# Patient Record
Sex: Male | Born: 1995 | Race: Black or African American | Hispanic: No | Marital: Single | Smoking: Never smoker
Health system: Southern US, Community
[De-identification: ages and names within clinical notes are randomized; demographics above are authoritative.]

---

## 2018-12-23 ENCOUNTER — Encounter (HOSPITAL_COMMUNITY): Payer: Self-pay | Admitting: Emergency Medicine

## 2018-12-23 ENCOUNTER — Emergency Department (HOSPITAL_COMMUNITY)
Admission: EM | Admit: 2018-12-23 | Discharge: 2018-12-23 | Disposition: A | Discharge status: Home / Self Care | Payer: 59 | Attending: Emergency Medicine | Admitting: Emergency Medicine

## 2018-12-23 ENCOUNTER — Other Ambulatory Visit: Payer: Self-pay

## 2018-12-23 DIAGNOSIS — R110 Nausea: Secondary | ICD-10-CM | POA: Diagnosis present

## 2018-12-23 DIAGNOSIS — R1110 Vomiting, unspecified: Secondary | ICD-10-CM | POA: Diagnosis not present

## 2018-12-23 NOTE — ED Provider Notes (Signed)
[facility] EMERGENCY DEPARTMENT Provider Note   CSN: [id number] Arrival date & time: 12/23/18  1612    History   Chief Complaint Chief Complaint  Patient presents with  . Nausea    HPI Luis Webb is a 23 y.o. male.     The history is provided by the patient. No language interpreter was used.  Emesis  Severity:  Moderate Duration:  3 days Timing:  Constant Progression:  Resolved Relieved by:  Nothing Worsened by:  Nothing Ineffective treatments:  None tried Associated symptoms: no abdominal pain   Risk factors: no alcohol use   Pt reports he became sick on Tuesday.  Pt had vomiting.  Vomiting has resolved but he has to have note to return to work  Pt feels like he can return tomorrow   History reviewed. No pertinent past medical history.  There are no active problems to display for this patient.   History reviewed. No pertinent surgical history.      Home Medications    Prior to Admission medications   Not on File    Family History History reviewed. No pertinent family history.  Social History Social History   Tobacco Use  . Smoking status: Never Smoker  Substance Use Topics  . Alcohol use: Never    Frequency: Never  . Drug use: Never     Allergies   Patient has no known allergies.   Review of Systems Review of Systems  Gastrointestinal: Positive for vomiting. Negative for abdominal pain.  All other systems reviewed and are negative.    Physical Exam Updated Vital Signs BP 140/81 (BP Location: Right Arm)   Pulse 74   Temp 97.9 F (36.6 C) (Oral)   Resp 18   Ht 5\' 10"  (1.778 m)   Wt 79.4 kg   SpO2 100%   BMI 25.11 kg/m   Physical Exam Vitals signs and nursing note reviewed.  Constitutional:      Appearance: He is well-developed.  HENT:     Head: Normocephalic and atraumatic.     Right Ear: Tympanic membrane normal.     Left Ear: Tympanic membrane normal.     Nose: Nose normal.     Mouth/Throat:     Mouth: Mucous  membranes are moist.  Eyes:     Conjunctiva/sclera: Conjunctivae normal.  Neck:     Musculoskeletal: Neck supple.  Cardiovascular:     Rate and Rhythm: Normal rate and regular rhythm.     Heart sounds: No murmur.  Pulmonary:     Effort: Pulmonary effort is normal. No respiratory distress.     Breath sounds: Normal breath sounds.  Abdominal:     General: Abdomen is flat.     Palpations: Abdomen is soft.     Tenderness: There is no abdominal tenderness.  Skin:    General: Skin is warm and dry.  Neurological:     General: No focal deficit present.     Mental Status: He is alert.  Psychiatric:        Mood and Affect: Mood normal.      ED Treatments / Results  Labs (all labs ordered are listed, but only abnormal results are displayed) Labs Reviewed - No data to display  EKG None  Radiology No results found.  Procedures Procedures (including critical care time)  Medications Ordered in ED Medications - No data to display   Initial Impression / Assessment and Plan / ED Course  I have reviewed the triage vital signs and the  nursing notes.  Pertinent labs & imaging results that were available during my care of the patient were reviewed by me and considered in my medical decision making (see chart for details).        An After Visit Summary was printed and given to the patient.   Final Clinical Impressions(s) / ED Diagnoses   Final diagnoses:  Vomiting, intractability of vomiting not specified, presence of nausea not specified, unspecified vomiting type    ED Discharge Orders    None       Osie Cheeks 12/23/18 1932    Benjiman Core, MD 12/23/18 [id number]

## 2018-12-23 NOTE — ED Triage Notes (Signed)
Pt states on Tuesday he was vomiting and since then has felt nauseated with no vomiting.  Was not able to go to work and also has a cough.  Needs note.

## 2018-12-23 NOTE — Discharge Instructions (Signed)
Return if any problems.

## 2019-01-04 ENCOUNTER — Encounter (HOSPITAL_COMMUNITY): Payer: Self-pay

## 2019-01-04 ENCOUNTER — Emergency Department (HOSPITAL_COMMUNITY): Payer: 59

## 2019-01-04 ENCOUNTER — Other Ambulatory Visit: Payer: Self-pay

## 2019-01-04 ENCOUNTER — Emergency Department (HOSPITAL_COMMUNITY)
Admission: EM | Admit: 2019-01-04 | Discharge: 2019-01-04 | Disposition: A | Discharge status: Home / Self Care | Payer: 59 | Attending: Emergency Medicine | Admitting: Emergency Medicine

## 2019-01-04 DIAGNOSIS — R1032 Left lower quadrant pain: Secondary | ICD-10-CM | POA: Insufficient documentation

## 2019-01-04 LAB — CBC
HCT: 47.5 % (ref 39.0–52.0)
Hemoglobin: 16.1 g/dL (ref 13.0–17.0)
MCH: 28 pg (ref 26.0–34.0)
MCHC: 33.9 g/dL (ref 30.0–36.0)
MCV: 82.6 fL (ref 80.0–100.0)
Platelets: 266 10*3/uL (ref 150–400)
RBC: 5.75 MIL/uL (ref 4.22–5.81)
RDW: 12.9 % (ref 11.5–15.5)
WBC: 6.6 10*3/uL (ref 4.0–10.5)
nRBC: 0 % (ref 0.0–0.2)

## 2019-01-04 LAB — URINALYSIS, ROUTINE W REFLEX MICROSCOPIC
Bilirubin Urine: NEGATIVE
Glucose, UA: NEGATIVE mg/dL
Hgb urine dipstick: NEGATIVE
KETONES UR: NEGATIVE mg/dL
LEUKOCYTE UA: NEGATIVE
Nitrite: NEGATIVE
Protein, ur: NEGATIVE mg/dL
Specific Gravity, Urine: 1.014 (ref 1.005–1.030)
pH: 8 (ref 5.0–8.0)

## 2019-01-04 LAB — LIPASE, BLOOD: LIPASE: 33 U/L (ref 11–51)

## 2019-01-04 LAB — DIFFERENTIAL
Abs Immature Granulocytes: 0.01 10*3/uL (ref 0.00–0.07)
Basophils Absolute: 0 10*3/uL (ref 0.0–0.1)
Basophils Relative: 1 %
Eosinophils Absolute: 0 10*3/uL (ref 0.0–0.5)
Eosinophils Relative: 0 %
Immature Granulocytes: 0 %
LYMPHS PCT: 29 %
Lymphs Abs: 1.9 10*3/uL (ref 0.7–4.0)
Monocytes Absolute: 0.5 10*3/uL (ref 0.1–1.0)
Monocytes Relative: 7 %
Neutro Abs: 4.3 10*3/uL (ref 1.7–7.7)
Neutrophils Relative %: 63 %

## 2019-01-04 LAB — COMPREHENSIVE METABOLIC PANEL
ALT: 35 U/L (ref 0–44)
AST: 22 U/L (ref 15–41)
Albumin: 4.8 g/dL (ref 3.5–5.0)
Alkaline Phosphatase: 83 U/L (ref 38–126)
Anion gap: 8 (ref 5–15)
BUN: 8 mg/dL (ref 6–20)
CO2: 28 mmol/L (ref 22–32)
Calcium: 9.6 mg/dL (ref 8.9–10.3)
Chloride: 101 mmol/L (ref 98–111)
Creatinine, Ser: 0.96 mg/dL (ref 0.61–1.24)
GFR calc Af Amer: >60 mL/min (ref >60)
GFR calc non Af Amer: >60 mL/min (ref >60)
Glucose, Bld: 101 mg/dL — ABNORMAL HIGH (ref 70–99)
Potassium: 4 mmol/L (ref 3.5–5.1)
Sodium: 137 mmol/L (ref 135–145)
Total Bilirubin: 0.7 mg/dL (ref 0.3–1.2)
Total Protein: 7.5 g/dL (ref 6.5–8.1)

## 2019-01-04 MED ORDER — DICYCLOMINE HCL 10 MG PO CAPS
10.0000 mg | ORAL_CAPSULE | Freq: Once | ORAL | Status: AC
  Administered 2019-01-04: 10 mg via ORAL
  Filled 2019-01-04: qty 1

## 2019-01-04 MED ORDER — IOHEXOL 300 MG/ML  SOLN
100.0000 mL | Freq: Once | INTRAMUSCULAR | Status: AC | PRN
  Administered 2019-01-04: 100 mL via INTRAVENOUS

## 2019-01-04 MED ORDER — DICYCLOMINE HCL 20 MG PO TABS: 20.0000 mg | ORAL_TABLET | Freq: Two times a day (BID) | ORAL | 0 refills | Status: AC

## 2019-01-04 MED ORDER — SODIUM CHLORIDE 0.9% FLUSH
3.0000 mL | Freq: Once | INTRAVENOUS | Status: AC
  Administered 2019-01-04: 3 mL via INTRAVENOUS

## 2019-01-04 NOTE — Discharge Instructions (Addendum)
Take medication as directed.  Return here if you develop any worsening symptoms such as increasing pain, vomiting or diarrhea, or fever.

## 2019-01-04 NOTE — ED Provider Notes (Signed)
[facility] EMERGENCY DEPARTMENT Provider Note   CSN: [id number] Arrival date & time: 01/04/19  1750    History   Chief Complaint Chief Complaint  Patient presents with   Abdominal Pain    HPI Luis Webb is a 23 y.o. male.     HPI   Luis Webb is a 23 y.o. male who presents to the Emergency Department complaining of persistent lower abdominal pain for 1 week.  He describes constant dull pain across his lower abdomen with intermittent sharp stabbing pains that initially began in the right lower abdomen, but has now "moved" to his left lower abdomen.  He states the sharp stabbing pains have been coming more frequent.  His symptoms have been associated with nausea at onset, but he denies vomiting or diarrhea, fever, cough or flank pain.  No urinary symptoms or pain to his testicles or swelling, no penile discharge.  He was seen here on 12/23/2018 for work note after symptoms of vomiting, but did not have abdominal pain at that time. No history of abdominal surgeries. No recent antibiotics.       History reviewed. No pertinent past medical history.  There are no active problems to display for this patient.   History reviewed. No pertinent surgical history.     Home Medications    Prior to Admission medications   Not on File    Family History No family history on file.  Social History Social History   Tobacco Use   Smoking status: Never Smoker   Smokeless tobacco: Never Used  Substance Use Topics   Alcohol use: Never    Frequency: Never   Drug use: Never     Allergies   Patient has no known allergies.   Review of Systems Review of Systems  Constitutional: Negative for appetite change, chills and fever.  Respiratory: Negative for cough and shortness of breath.   Cardiovascular: Negative for chest pain.  Gastrointestinal: Positive for abdominal pain and nausea. Negative for blood in stool, diarrhea and vomiting.  Genitourinary: Negative  for decreased urine volume, difficulty urinating, discharge, dysuria, flank pain, penile swelling, scrotal swelling and testicular pain.  Musculoskeletal: Negative for back pain.  Skin: Negative for color change and rash.  Neurological: Negative for dizziness, weakness and numbness.  Hematological: Negative for adenopathy.     Physical Exam Updated Vital Signs BP 127/89 (BP Location: Right Arm)    Pulse 85    Temp 98.2 F (36.8 C) (Oral)    Resp 16    Ht 5\' 10"  (1.778 m)    Wt 74.8 kg    SpO2 97%    BMI 23.68 kg/m   Physical Exam Vitals signs and nursing note reviewed.  Constitutional:      General: He is not in acute distress.    Appearance: Normal appearance. He is well-developed. He is not ill-appearing.  HENT:     Head: Normocephalic.  Cardiovascular:     Rate and Rhythm: Normal rate and regular rhythm.     Heart sounds: Normal heart sounds. No murmur.  Pulmonary:     Effort: Pulmonary effort is normal. No respiratory distress.     Breath sounds: Normal breath sounds.  Abdominal:     General: Bowel sounds are normal. There is no distension.     Palpations: Abdomen is soft. There is no mass.     Tenderness: There is abdominal tenderness in the right lower quadrant and left lower quadrant. There is no right CVA tenderness, left CVA tenderness,  guarding or rebound.     Comments: Mild to moderate tenderness of the right and left lower abdomen.  No guarding or rebound.  Abdomen is soft and without peritoneal signs.  Musculoskeletal: Normal range of motion.  Skin:    General: Skin is warm.     Findings: No rash.  Neurological:     General: No focal deficit present.     Mental Status: He is alert. Mental status is at baseline.     Motor: No abnormal muscle tone.     Coordination: Coordination normal.      ED Treatments / Results  Labs (all labs ordered are listed, but only abnormal results are displayed) Labs Reviewed  COMPREHENSIVE METABOLIC PANEL - Abnormal; Notable  for the following components:      Result Value   Glucose, Bld 101 (*)    All other components within normal limits  URINALYSIS, ROUTINE W REFLEX MICROSCOPIC - Abnormal; Notable for the following components:   APPearance HAZY (*)    All other components within normal limits  LIPASE, BLOOD  CBC  DIFFERENTIAL    EKG None  Radiology Ct Abdomen Pelvis W Contrast  Result Date: 01/04/2019 CLINICAL DATA:  23 year old male with left abdominal pain for 6 days with nausea. EXAM: CT ABDOMEN AND PELVIS WITH CONTRAST TECHNIQUE: Multidetector CT imaging of the abdomen and pelvis was performed using the standard protocol following bolus administration of intravenous contrast. CONTRAST:  OMNIPAQUE IOHEXOL 300 MG/ML  SOLN COMPARISON:  None. FINDINGS: Lower chest: Negative. No pericardial or pleural effusion. Hepatobiliary: Negative liver and gallbladder. No bile duct enlargement. Pancreas: Negative. Spleen: Negative. Adrenals/Urinary Tract: Normal adrenal glands. Bilateral renal enhancement appears symmetric and normal. No nephrolithiasis is evident. Renal pelves and proximal ureters are decompressed and appear normal. Unremarkable urinary bladder. Stomach/Bowel: Negative descending and rectosigmoid colon. Decompressed and negative transverse colon. Much of the right colon is decompressed. Most of the cecum is located in the right hemipelvis. The appendix is normal (coronal image 44) tracking cephalad toward the pelvic inlet. Negative terminal ileum, located near midline. No dilated small bowel. Decompressed stomach. No mesenteric stranding identified. No free air, free fluid. Vascular/Lymphatic: Major arterial structures appear patent and normal. Portal venous system appears to be patent. No lymphadenopathy.  No abdominal hernia identified. Reproductive: Negative. Other: No pelvic free fluid. Musculoskeletal: Negative. IMPRESSION: No abnormality identified on CT Abdomen and Pelvis. Electronically Signed    By: Odessa Fleming M.D.   On: 01/04/2019 20:50    Procedures Procedures (including critical care time)  Medications Ordered in ED Medications  sodium chloride flush (NS) 0.9 % injection 3 mL (3 mLs Intravenous Given 01/04/19 1925)  iohexol (OMNIPAQUE) 300 MG/ML solution 100 mL (100 mLs Intravenous Contrast Given 01/04/19 2029)     Initial Impression / Assessment and Plan / ED Course  I have reviewed the triage vital signs and the nursing notes.  Pertinent labs & imaging results that were available during my care of the patient were reviewed by me and considered in my medical decision making (see chart for details).       Patient with diffuse pain of lower abdomen.  No peritoneal signs on exam.  Work-up this evening is reassuring.  Of note, he was here on the 14th requesting return to work note after several days of vomiting.  No fever here or at home. Patient appears appropriate for discharge home, prescription written for Bentyl he agrees to outpatient follow-up if needed.  Return precautions were discussed.  Final Clinical Impressions(s) / ED Diagnoses   Final diagnoses:  Left lower quadrant abdominal pain    ED Discharge Orders    None       Pauline Aus, PA-C 01/05/19 Raechel Chute, MD 01/17/19 [phone]

## 2019-01-04 NOTE — ED Triage Notes (Signed)
Pt presents to ED with complaints of lower left side abdominal pain which started last Friday, which at that time he had nausea also but none since. Pt denies N/V/D.

## 2019-11-05 ENCOUNTER — Other Ambulatory Visit: Payer: Self-pay

## 2019-11-05 ENCOUNTER — Ambulatory Visit: Payer: 59 | Attending: Internal Medicine

## 2019-11-05 DIAGNOSIS — Z20822 Contact with and (suspected) exposure to covid-19: Secondary | ICD-10-CM

## 2019-11-06 LAB — NOVEL CORONAVIRUS, NAA: SARS-CoV-2, NAA: NOT DETECTED

## 2019-11-19 ENCOUNTER — Other Ambulatory Visit: Payer: Self-pay

## 2019-11-19 ENCOUNTER — Ambulatory Visit: Payer: 59 | Attending: Internal Medicine

## 2019-11-19 DIAGNOSIS — Z20822 Contact with and (suspected) exposure to covid-19: Secondary | ICD-10-CM

## 2019-11-20 LAB — NOVEL CORONAVIRUS, NAA: SARS-CoV-2, NAA: NOT DETECTED

## 2020-03-08 IMAGING — CT CT ABDOMEN AND PELVIS WITH CONTRAST
2 of 4 series · 16 of 46 positions shown, 18 images · IV contrast (omnipaque)
Comparison: None.

CLINICAL DATA: 22-year-old male with left abdominal pain for 6 days
with nausea.

EXAM:
CT ABDOMEN AND PELVIS WITH CONTRAST
TECHNIQUE: Multidetector CT imaging of the abdomen and pelvis was performed
using the standard protocol following bolus administration of
intravenous contrast.
CONTRAST:  100mL OMNIPAQUE IOHEXOL 300 MG/ML  SOLN

[Series 2: axial st · axial · 0.69mm/px · z∈[+1030,+1450]mm · 13 of 94 slices shown, 15 images]
[im 5/94  soft-tissue]
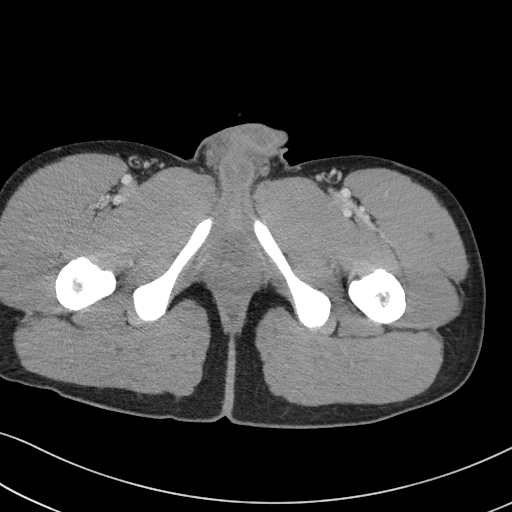
[im 5/94  bone]
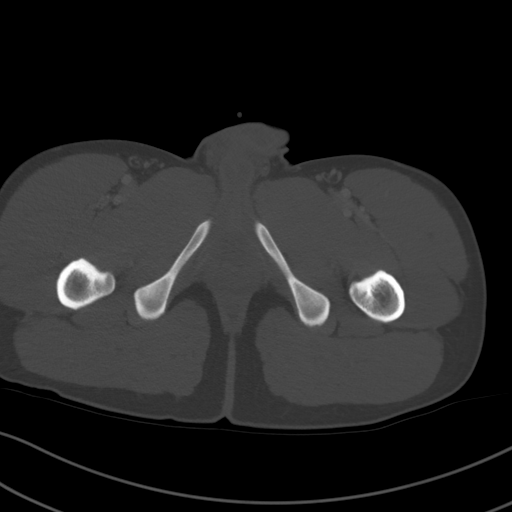
[im 13/94  soft-tissue]
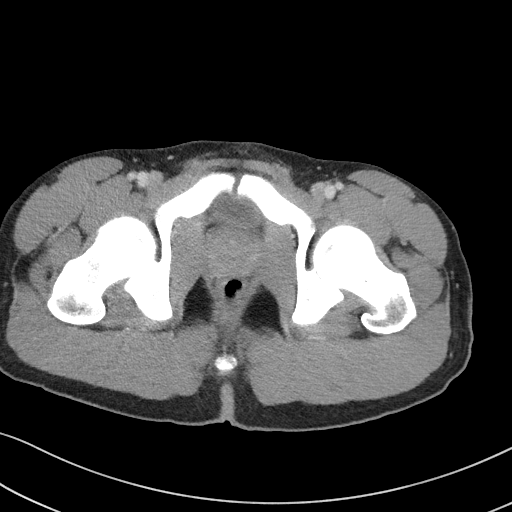
[im 21/94  soft-tissue]
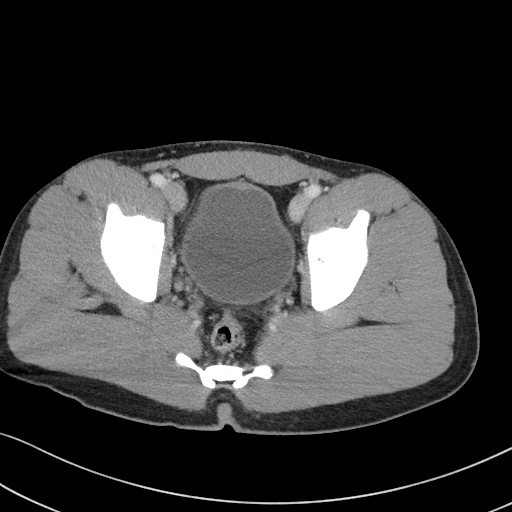
[im 25/94  soft-tissue]
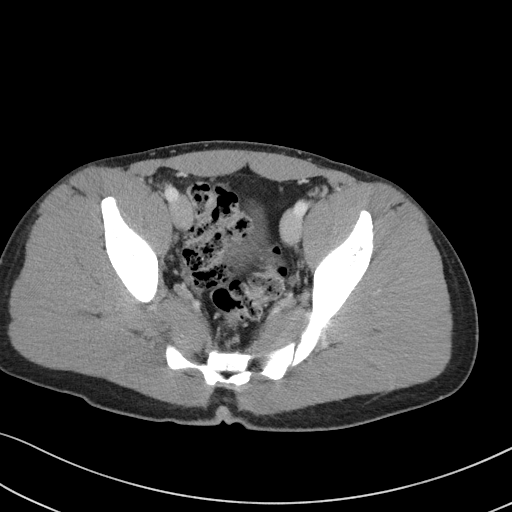
[im 33/94  soft-tissue]
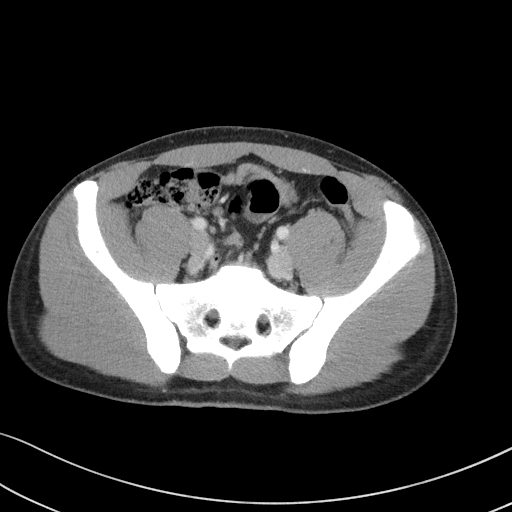
[im 41/94  soft-tissue]
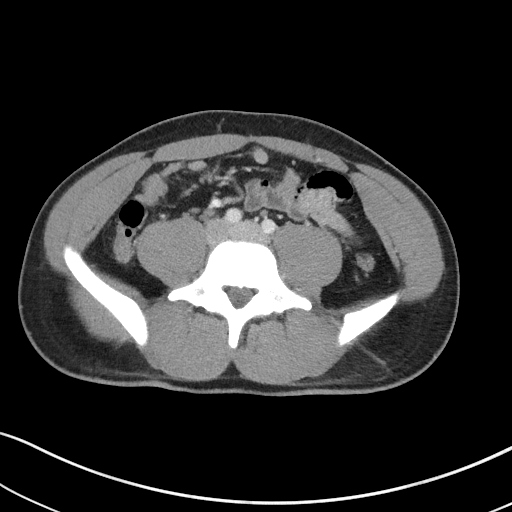
[im 49/94  soft-tissue]
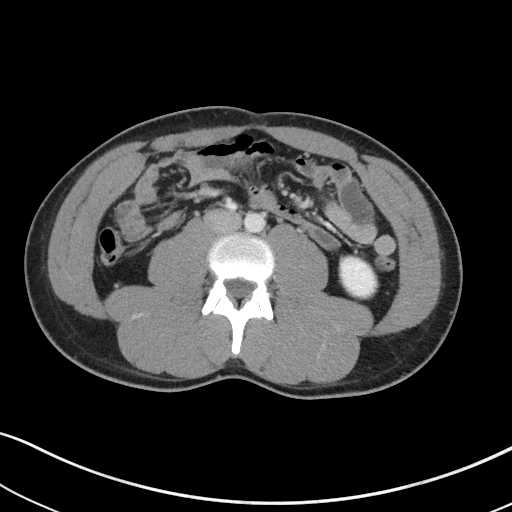
[im 53/94  soft-tissue]
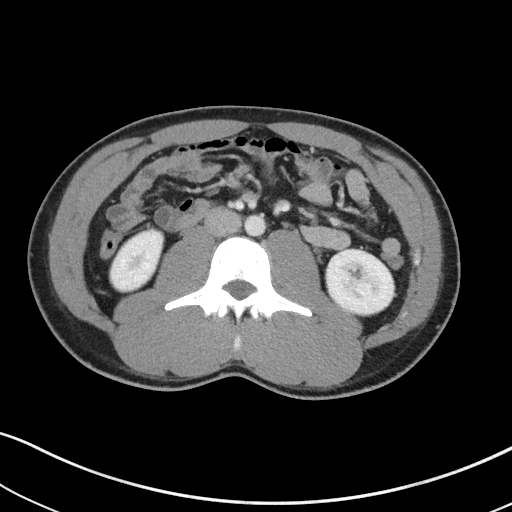
[im 61/94  soft-tissue]
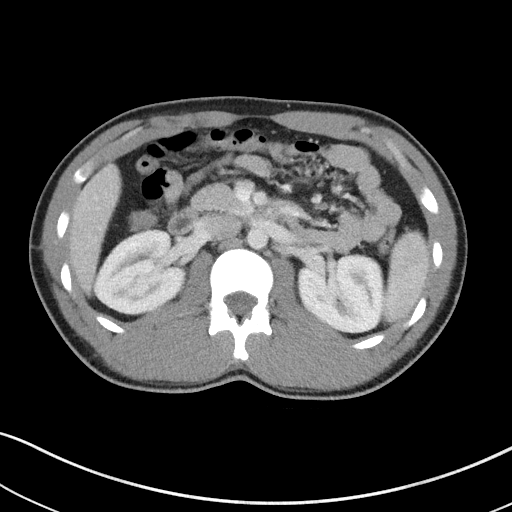
[im 61/94  bone]
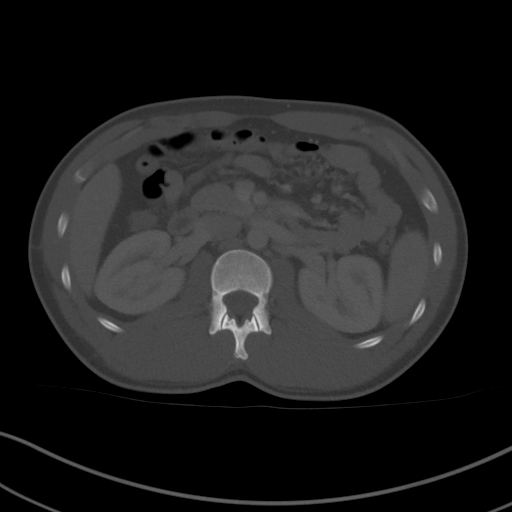
[im 69/94  soft-tissue]
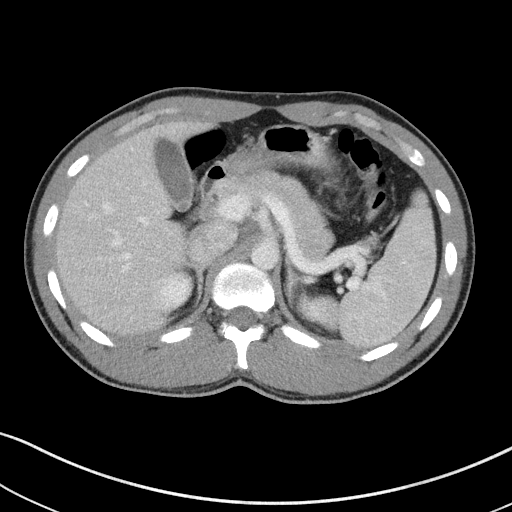
[im 73/94  soft-tissue]
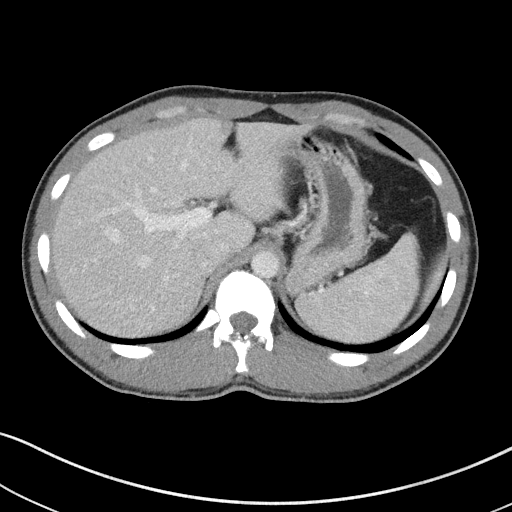
[im 81/94  soft-tissue]
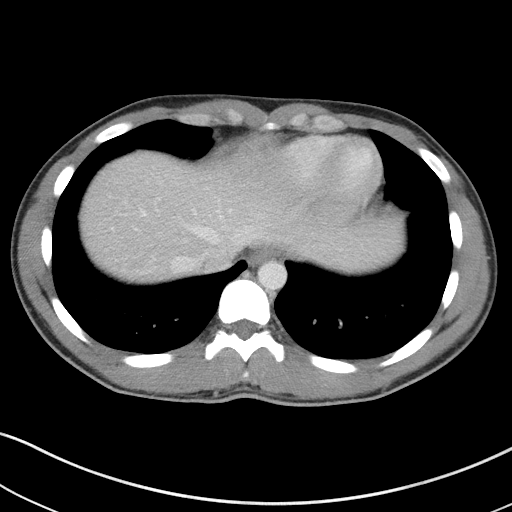
[im 89/94  soft-tissue]
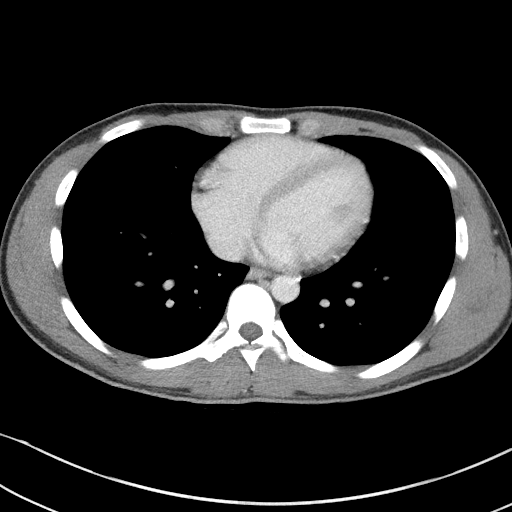

[Series 5: coronal st · coronal · 0.63mm/px · 3 of 92 slices shown]
[im 31/92  soft-tissue]
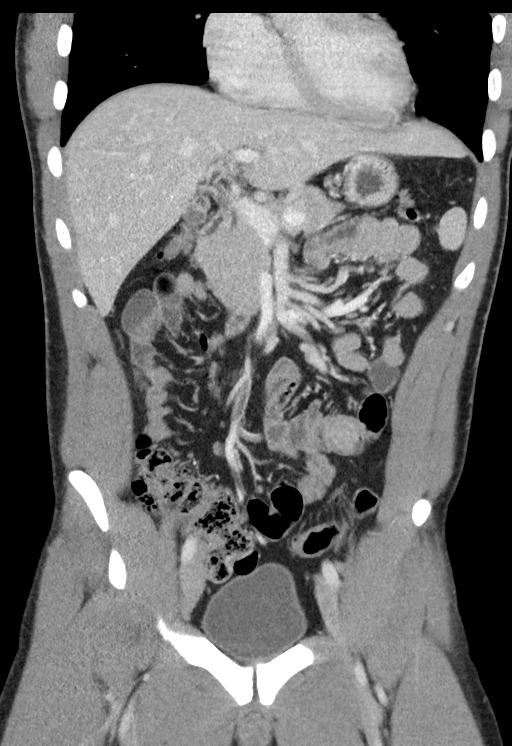
[im 41/92  soft-tissue]
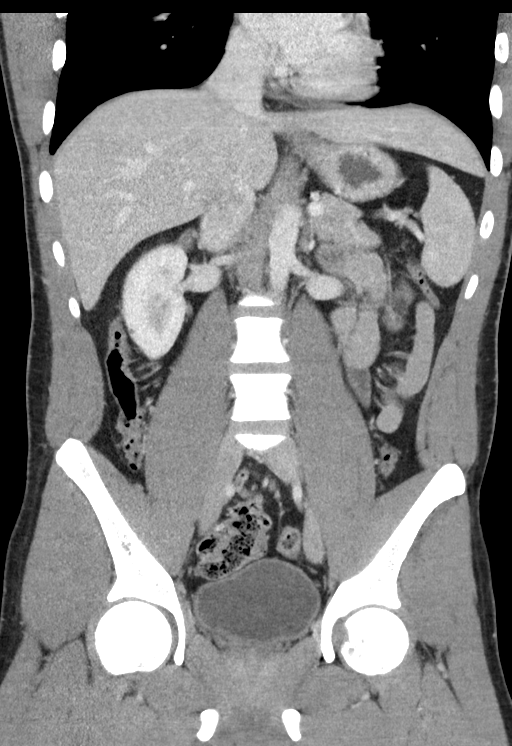
[im 51/92  soft-tissue]
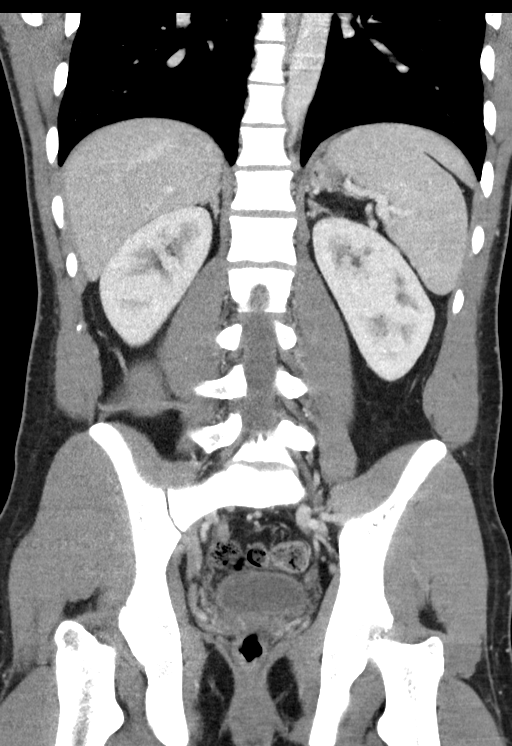

[16 of 46 positions shown; findings below may reference images not displayed]

FINDINGS: Lower chest: Negative. No pericardial or pleural effusion.

Hepatobiliary: Negative liver and gallbladder. No bile duct
enlargement.

Pancreas: Negative.

Spleen: Negative.

Adrenals/Urinary Tract: Normal adrenal glands.

Bilateral renal enhancement appears symmetric and normal. No
nephrolithiasis is evident. Renal pelves and proximal ureters are
decompressed and appear normal.

Unremarkable urinary bladder.

Stomach/Bowel: Negative descending and rectosigmoid colon.

Decompressed and negative transverse colon.

Much of the right colon is decompressed. Most of the cecum is
located in the right hemipelvis.

The appendix is normal (coronal image 44) tracking cephalad toward
the pelvic inlet.

Negative terminal ileum, located near midline. No dilated small
bowel. Decompressed stomach. No mesenteric stranding identified.

No free air, free fluid.

Vascular/Lymphatic: Major arterial structures appear patent and
normal. Portal venous system appears to be patent.

No lymphadenopathy.  No abdominal hernia identified.

Reproductive: Negative.

Other: No pelvic free fluid.

Musculoskeletal: Negative.
IMPRESSION: No abnormality identified on CT Abdomen and Pelvis.
